# Patient Record
Sex: Male | Born: 1990 | Race: White | Hispanic: No | Marital: Single | Smoking: Current every day smoker
Health system: Southern US, Academic
[De-identification: ages and names within clinical notes are randomized; demographics above are authoritative.]

## PROBLEM LIST (undated history)

## (undated) DIAGNOSIS — Z789 Other specified health status: Secondary | ICD-10-CM

## (undated) HISTORY — PX: DENTAL SURGERY: SHX609

## (undated) HISTORY — PX: SHOULDER SURGERY: SHX246

## (undated) HISTORY — DX: Other specified health status: Z78.9

## (undated) HISTORY — PX: HX BACK SURGERY: SHX140

---

## 2019-04-01 IMAGING — CR XRAY KNEE 3 VIEWS LT
1 series · 3 of 3 positions shown · non-contrast
Comparison: None available.

EXAM:  XRAY KNEE 3 VIEWS LT
INDICATION: Left knee pain.

[Series 5: view not recorded · 0.17mm/px · 3 of 3 slices shown]
[im 1/3]
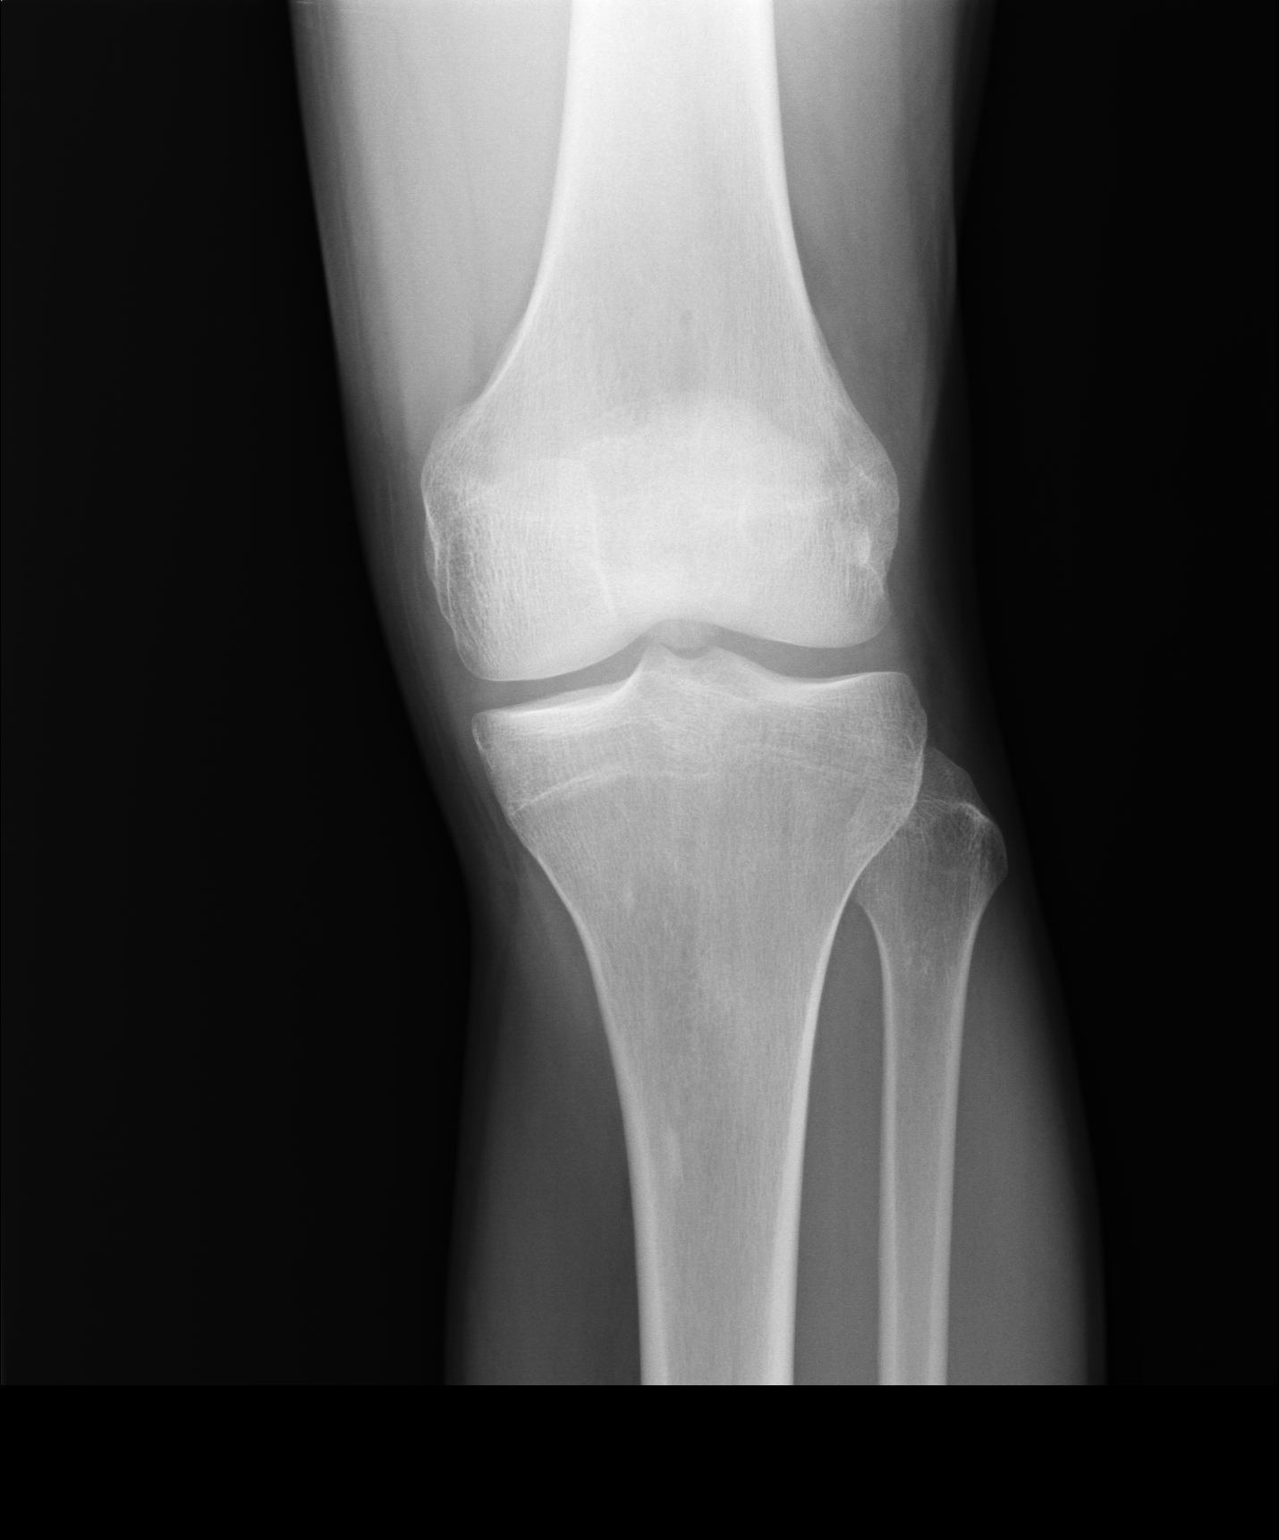
[im 2/3]
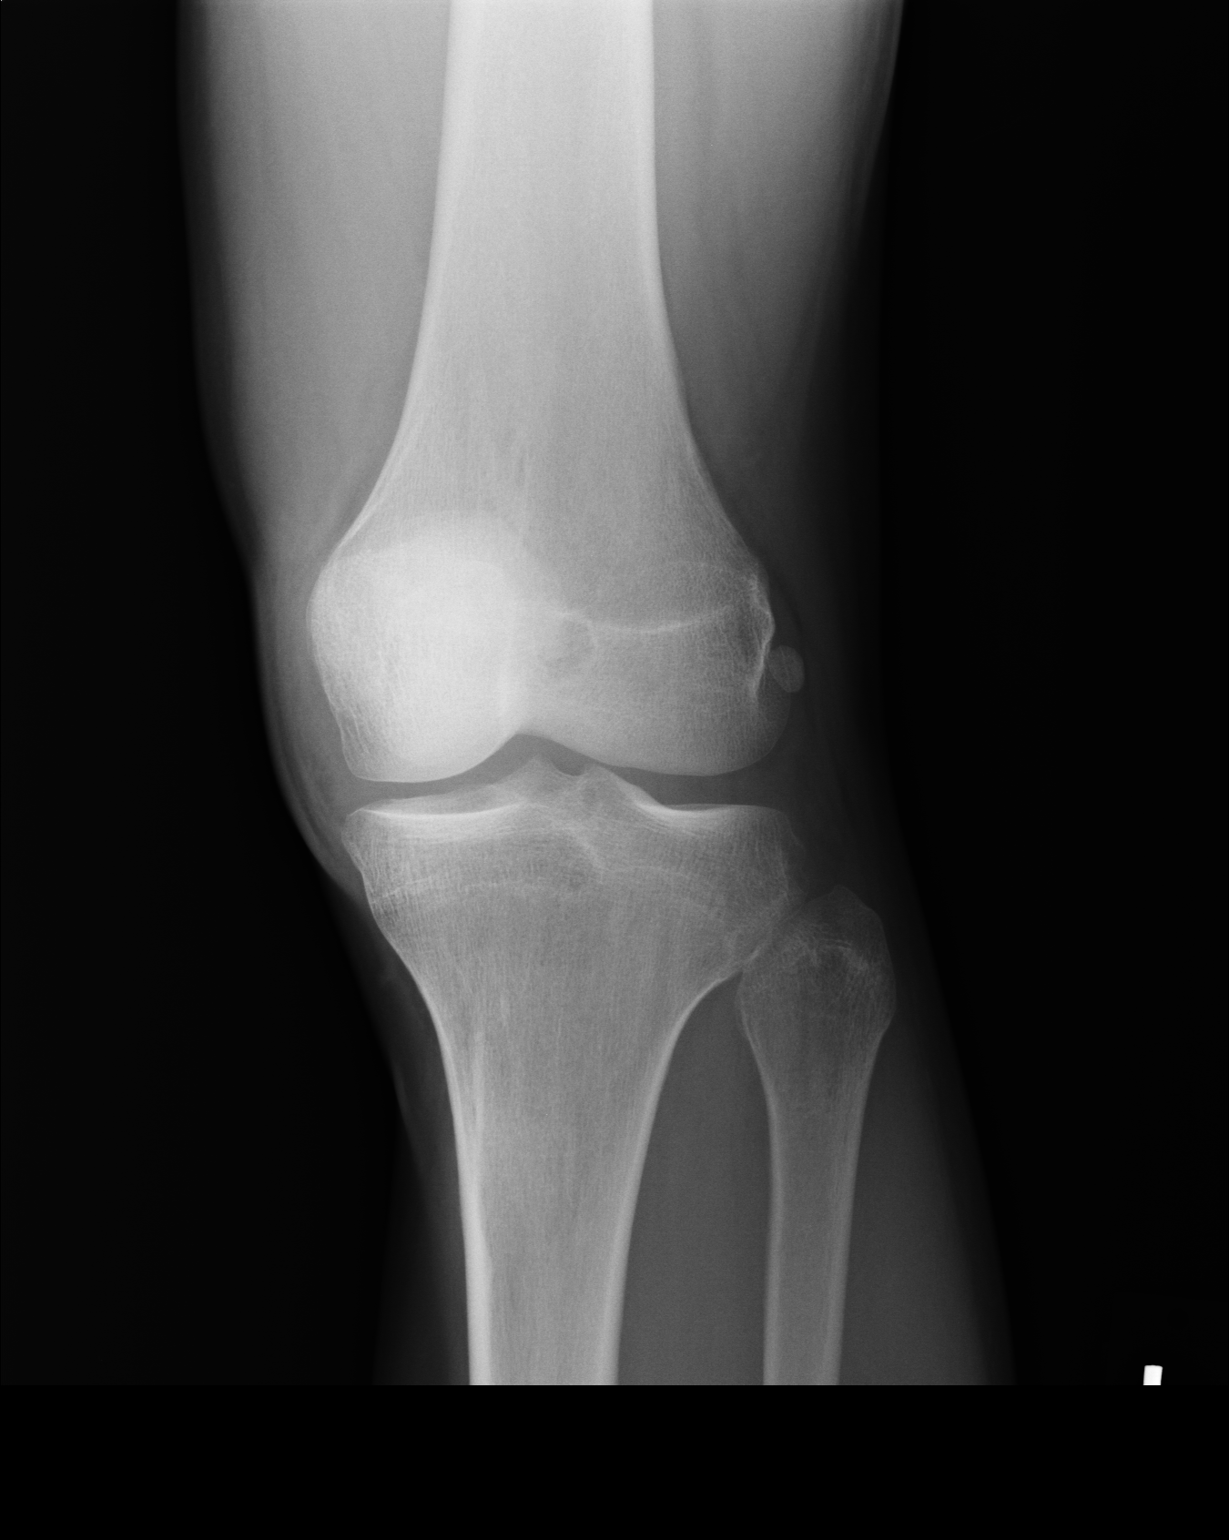
[im 3/3]
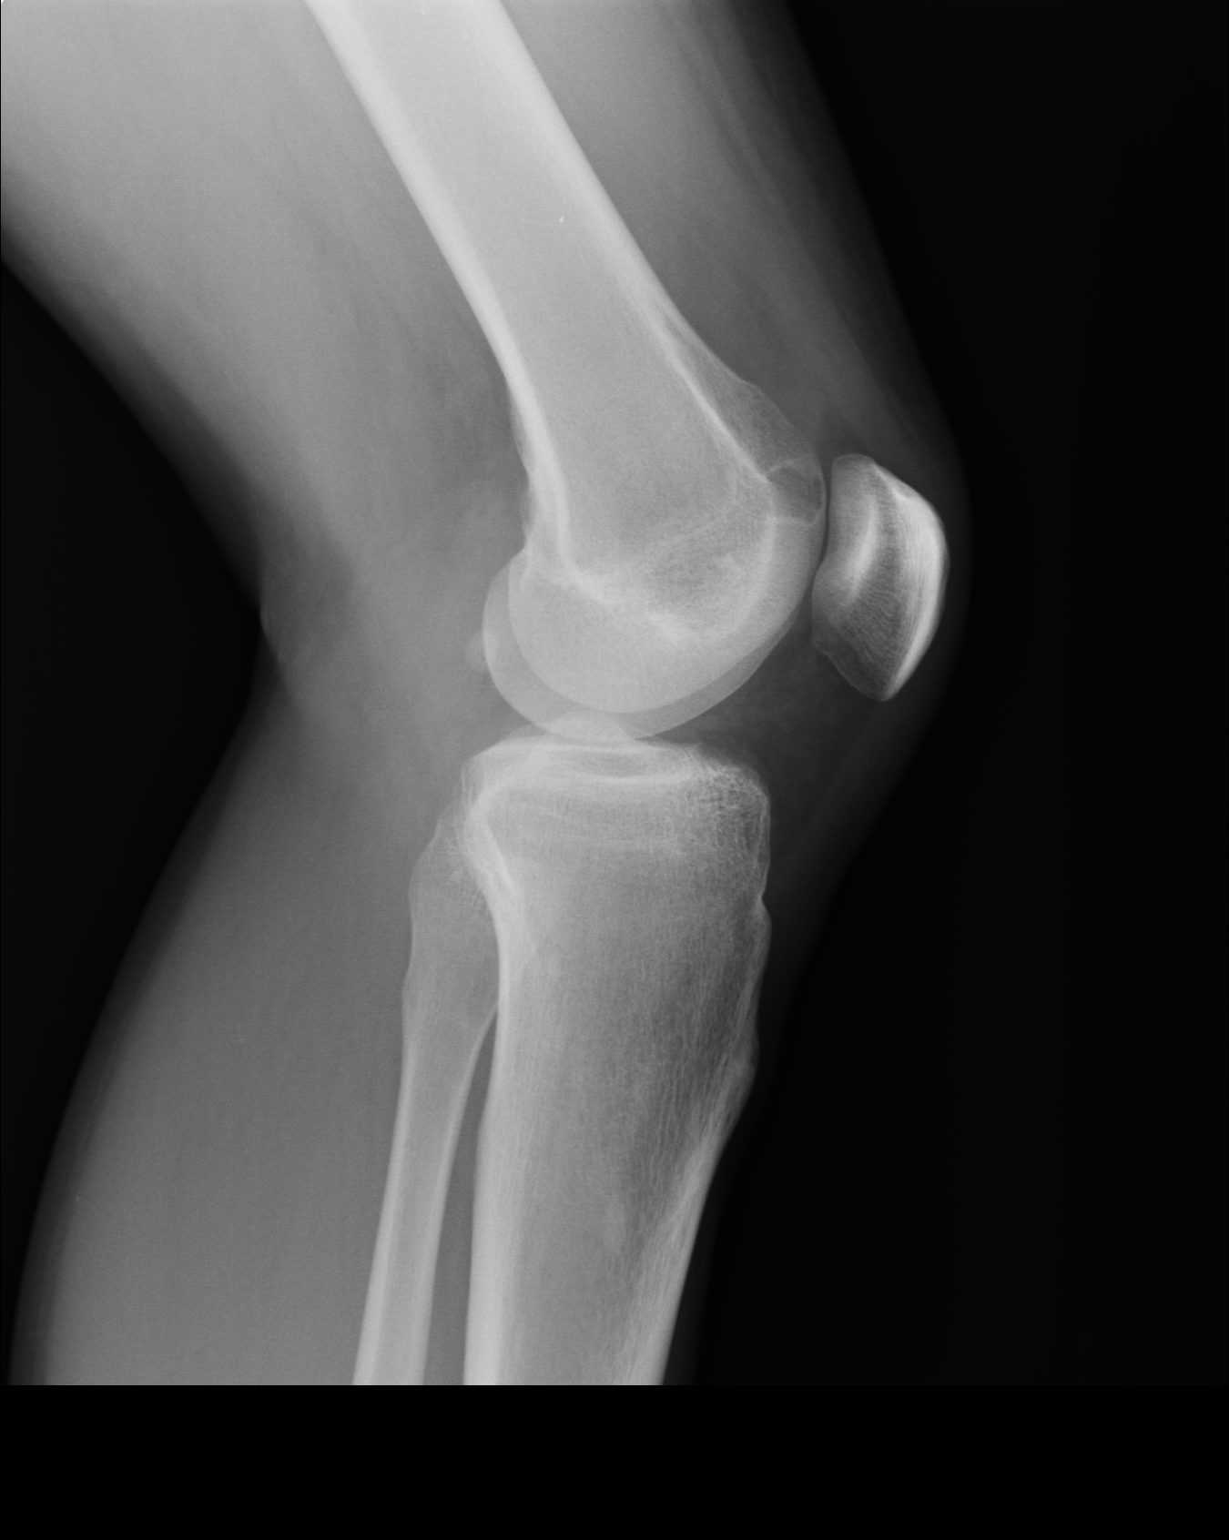

[3 of 3 positions shown; findings below may reference images not displayed]

FINDINGS: Multiple views of the patient’s left knee show normal alignment with adequate joint space and good bony mineralization.  No fracture, dislocation or intraarticular effusion is observed.
IMPRESSION: Unremarkable left knee.

## 2019-05-19 IMAGING — MR MRI LUMBAR SPINE WITHOUT CONTRAST
6 series · 44 of 48 positions shown · IV contrast (gadolinium)
Comparison: None available.

EXAM:  MRI LUMBAR SPINE WITHOUT CONTRAST
INDICATION: Lower back pain with right lower extremity radiculopathy.
TECHNIQUE: Multiplanar multisequential MRI of the lumbar spine was performed without gadolinium contrast.

[Series 9: T1 · sagittal · 4.0mm · 0.94mm/px · 5 of 13 slices shown (1 of 2)]
[im 1/13]
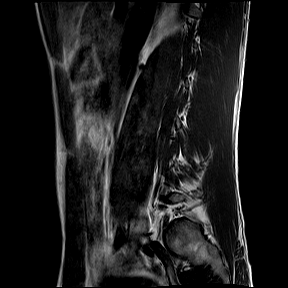
[im 4/13]
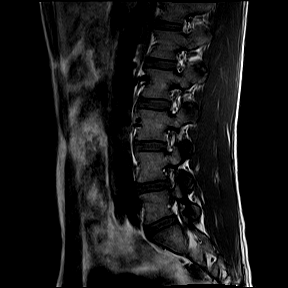
[im 7/13]
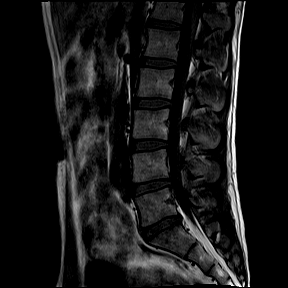
[im 10/13]
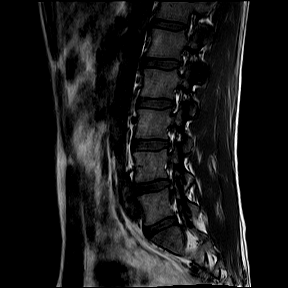
[im 13/13]
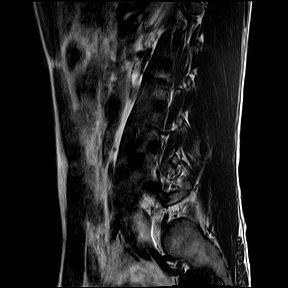

[Series 10: T2 · sagittal · 4.0mm · 0.94mm/px · 6 of 13 slices shown (1 of 3)]
[im 1/13]
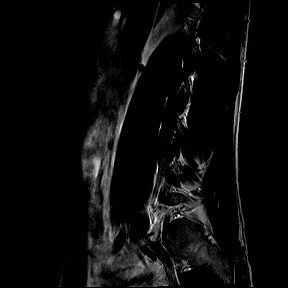
[im 3/13]
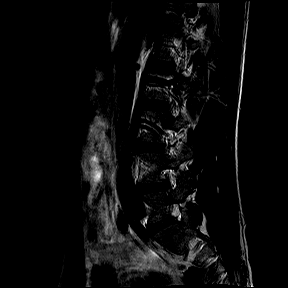
[im 5/13]
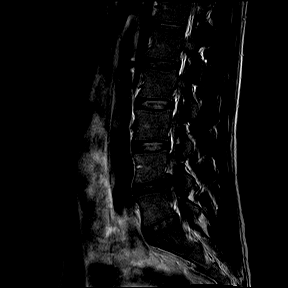
[im 8/13]
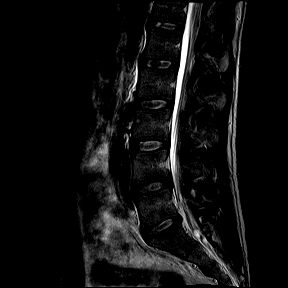
[im 10/13]
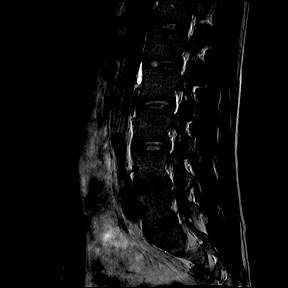
[im 13/13]
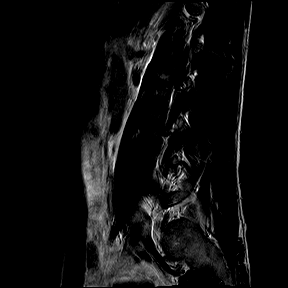

[Series 13: STIR · sagittal · 4.0mm · 1.05mm/px · 5 of 13 slices shown]
[im 1/13]
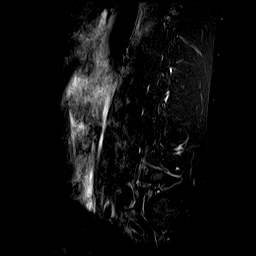
[im 3/13]
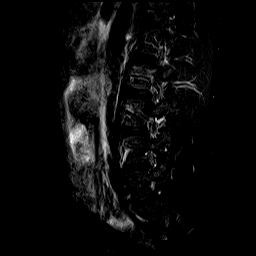
[im 5/13]
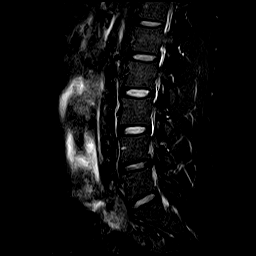
[im 8/13]
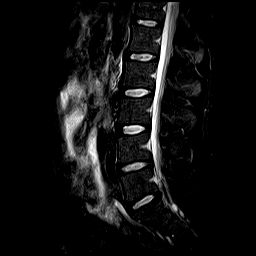
[im 10/13]
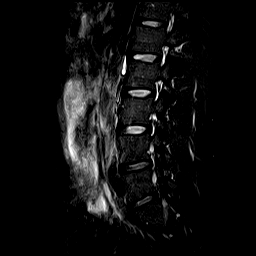

[Series 14: T2 · coronal · 4.0mm · 0.90mm/px · 9 of 20 slices shown (2 of 3)]
[im 1/20]
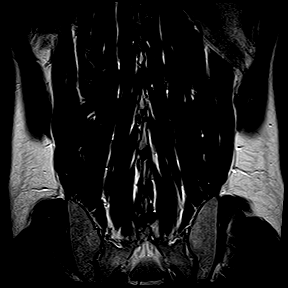
[im 3/20]
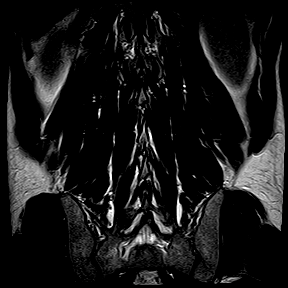
[im 5/20]
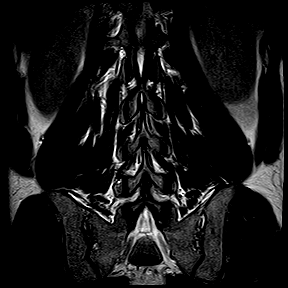
[im 8/20]
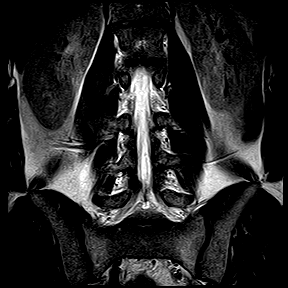
[im 10/20]
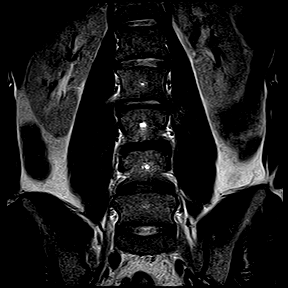
[im 12/20]
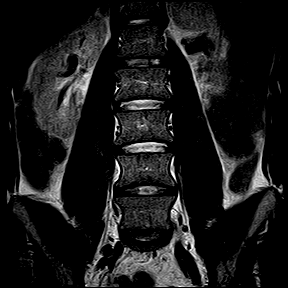
[im 15/20]
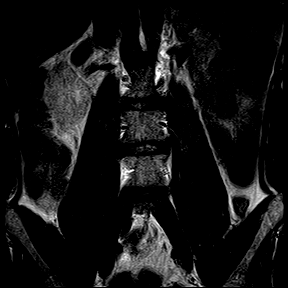
[im 17/20]
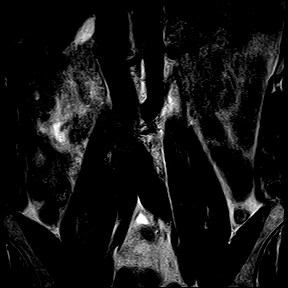
[im 20/20]
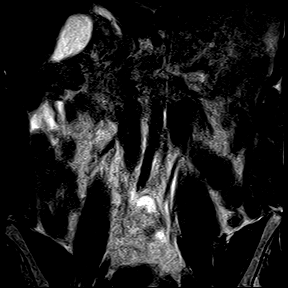

[Series 15: T2 · axial · 4.0mm · 0.47mm/px · z∈[-131,+79]mm · 11 of 23 slices shown (3 of 3)]
[im 1/23]
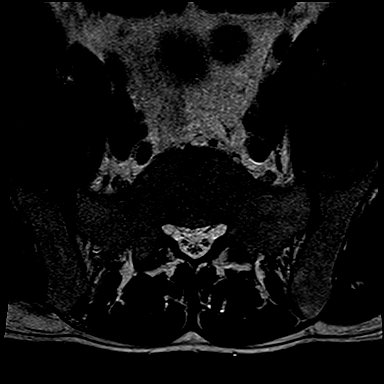
[im 3/23]
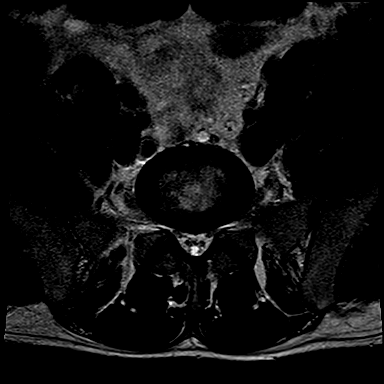
[im 5/23]
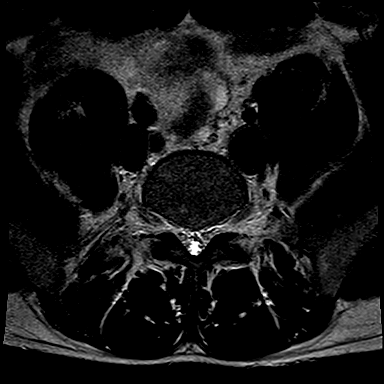
[im 7/23]
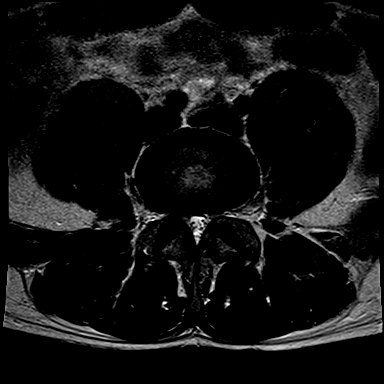
[im 9/23]
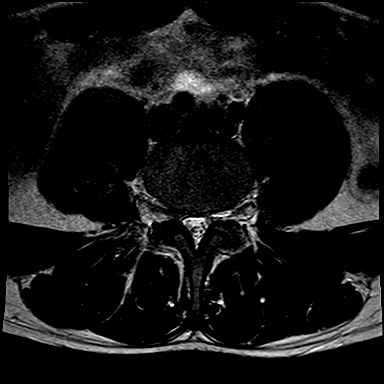
[im 12/23]
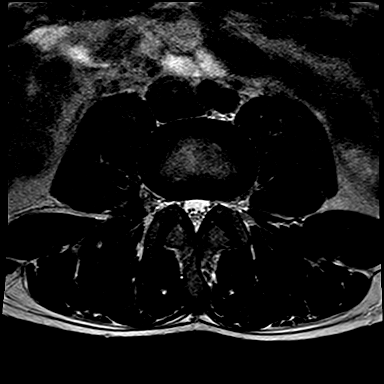
[im 14/23]
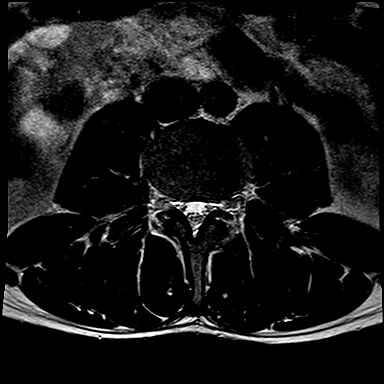
[im 16/23]
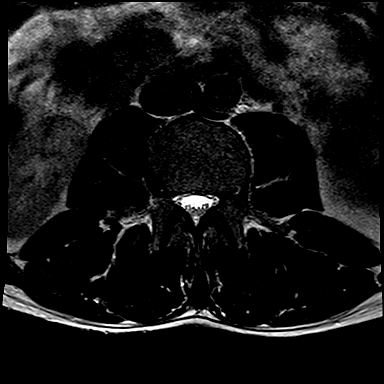
[im 18/23]
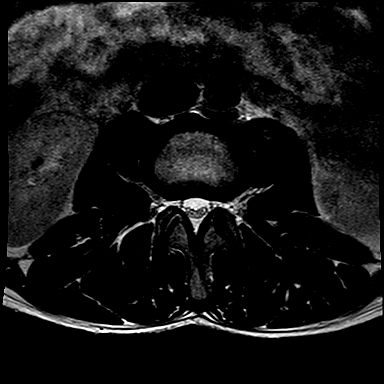
[im 20/23]
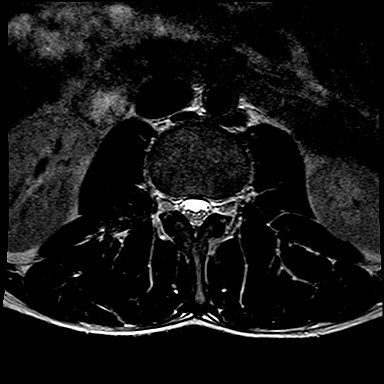
[im 23/23]
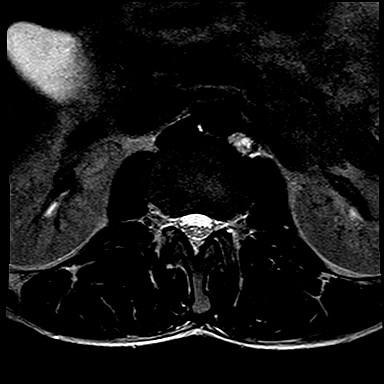

[Series 16: T1 · axial · 4.0mm · 0.47mm/px · z∈[-131,+79]mm · 8 of 23 slices shown (2 of 2)]
[im 1/23]
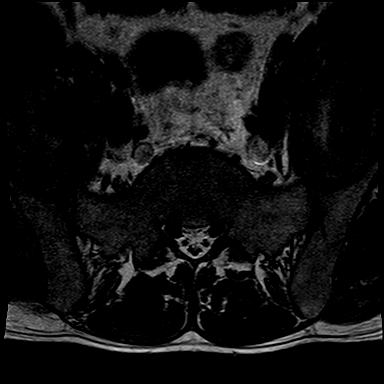
[im 5/23]
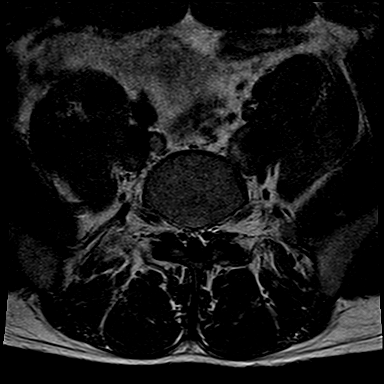
[im 7/23]
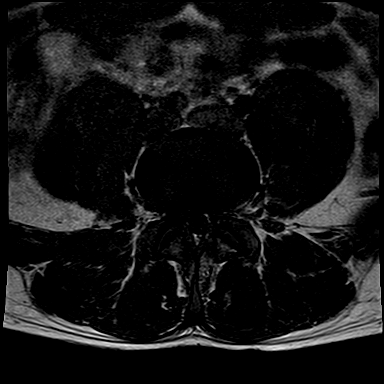
[im 9/23]
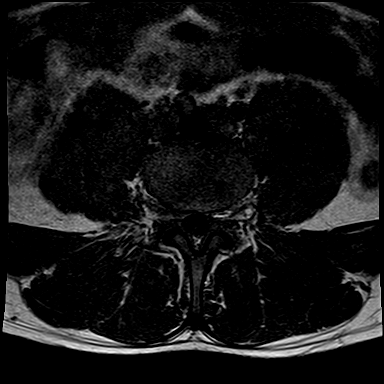
[im 14/23]
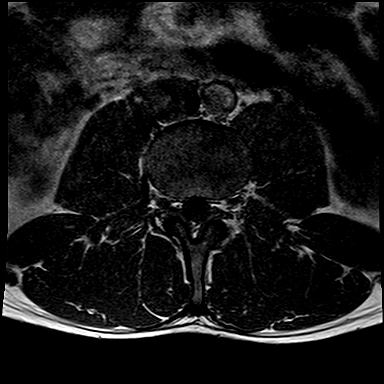
[im 16/23]
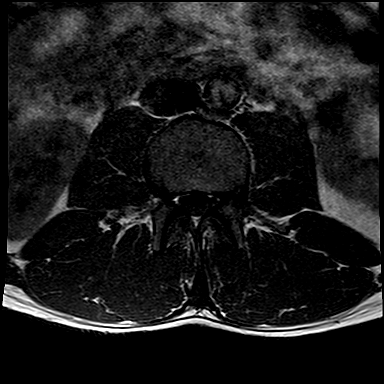
[im 18/23]
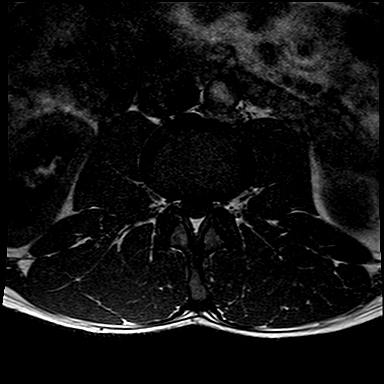
[im 23/23]
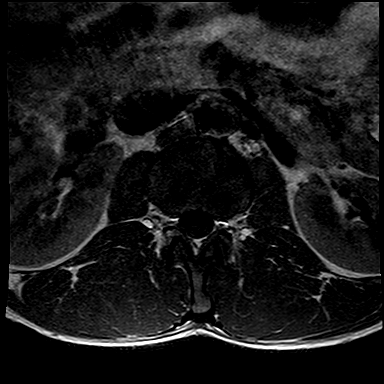

[44 of 48 positions shown; findings below may reference images not displayed]

FINDINGS: Vertebral bodies are normal in height, alignment and signal intensity. There is no acute fracture or subluxation. Distal spinal cord is normal in signal intensity and terminates normally at T12-L1 disc space level. Spinal canal is congenitally narrow.

L1-2, L2-3 and L3-4 levels are unremarkable.

At L4-5 level, there is a small broad-based right paracentral disc bulge resulting in severe right lateral recess compression. There is mild left and moderate right neural foraminal stenosis from facet arthropathy and bulging annulus.

L5-S1 level and paraspinal soft tissues are unremarkable.
IMPRESSION: Severe right lateral recess compression at L4-5 level from a small right paracentral disc bulge.

## 2021-03-29 IMAGING — MR MRI LUMBAR SPINE WITHOUT CONTRAST
5 of 6 series · 32 of 48 positions shown · IV contrast (gadolinium)
Comparison: MRI dated 05/19/2019.

﻿EXAM:  [id number]   MRI LUMBAR SPINE WITHOUT CONTRAST
INDICATION: Lower back pain with right lower extremity radiculopathy. History of surgery in July 2019.
TECHNIQUE: Multiplanar multisequential MRI of the lumbar spine was performed without gadolinium contrast.

[Series 5: T2 · sagittal · 4.0mm · 0.83mm/px · 6 of 13 slices shown (1 of 3)]
[im 1/13]
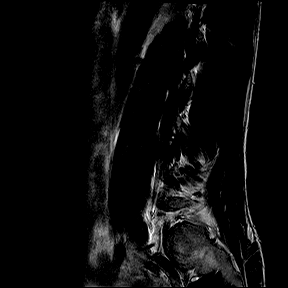
[im 3/13]
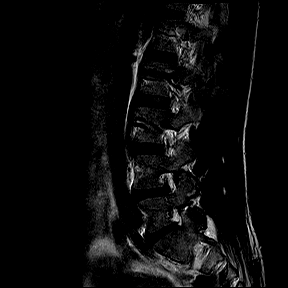
[im 5/13]
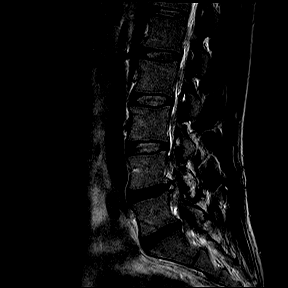
[im 8/13]
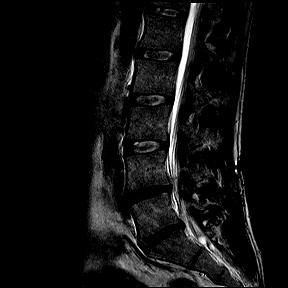
[im 10/13]
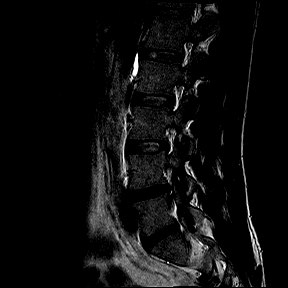
[im 13/13]
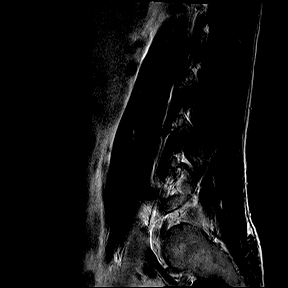

[Series 6: T1 · sagittal · 4.0mm · 0.83mm/px · 6 of 13 slices shown (1 of 2)]
[im 1/13]
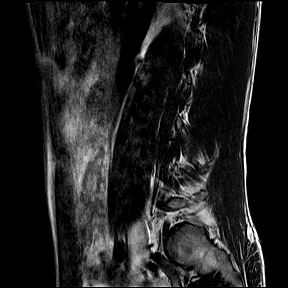
[im 3/13]
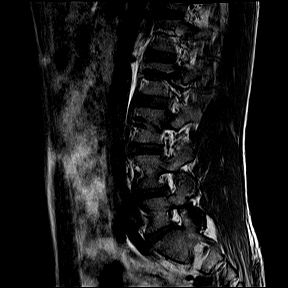
[im 5/13]
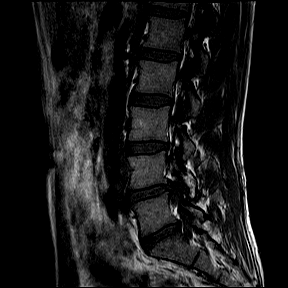
[im 8/13]
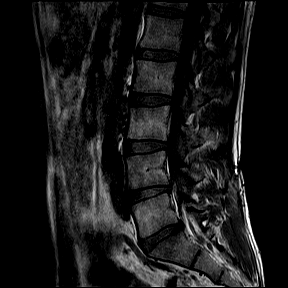
[im 10/13]
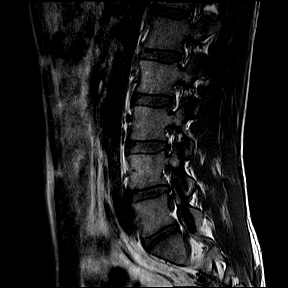
[im 13/13]
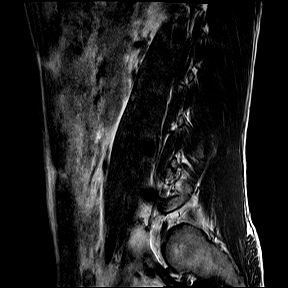

[Series 8: T2 · axial · 4.0mm · 0.52mm/px · z∈[-111,+106]mm · 11 of 23 slices shown (2 of 3)]
[im 1/23]
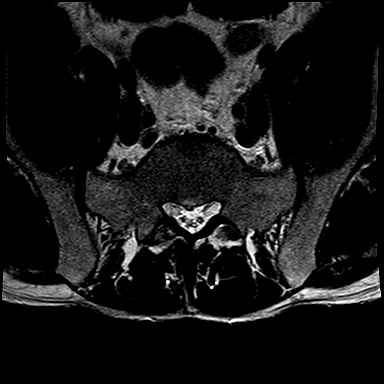
[im 3/23]
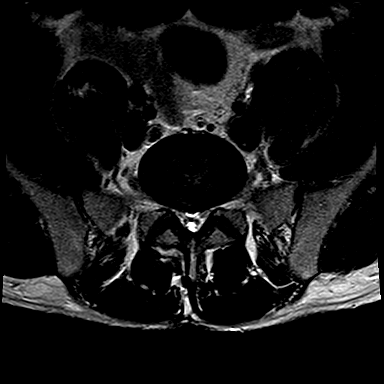
[im 5/23]
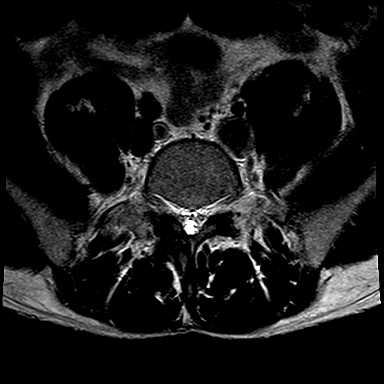
[im 7/23]
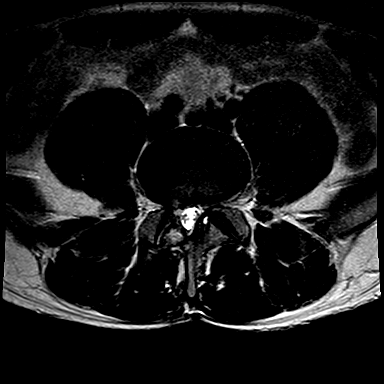
[im 9/23]
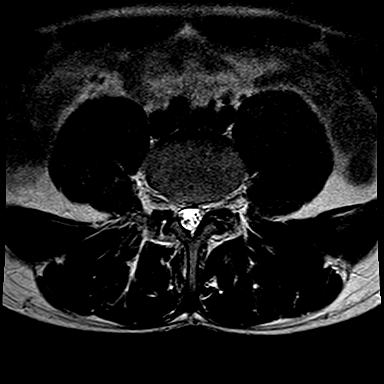
[im 12/23]
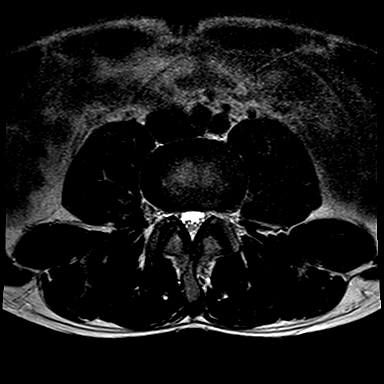
[im 14/23]
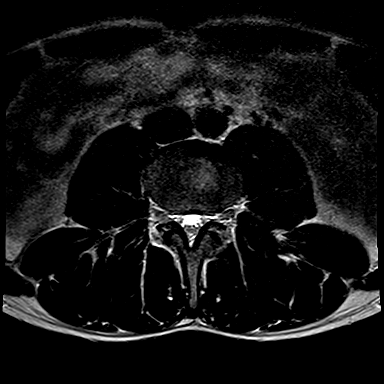
[im 16/23]
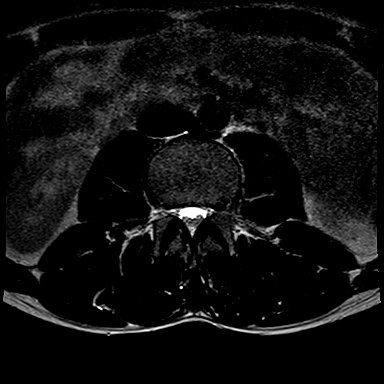
[im 18/23]
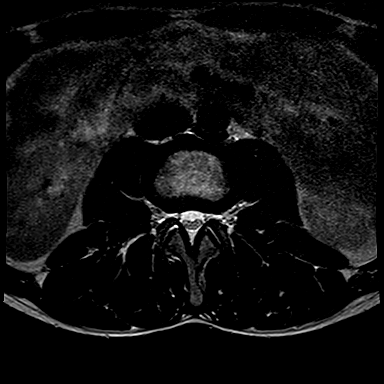
[im 20/23]
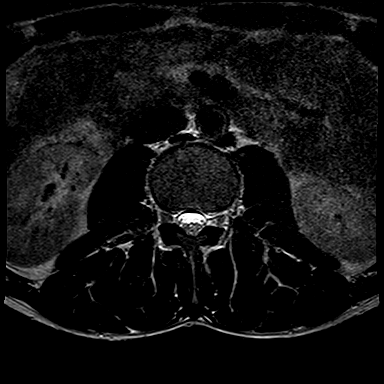
[im 23/23]
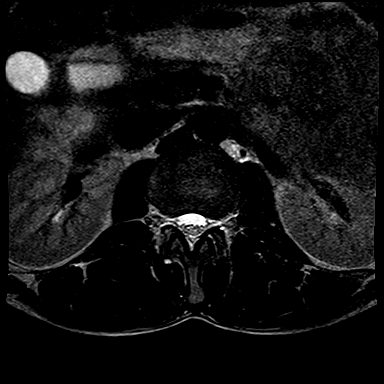

[Series 9: T1 · axial · 4.0mm · 0.52mm/px · 1 of 23 slices shown (2 of 2)]
[im 1/23]
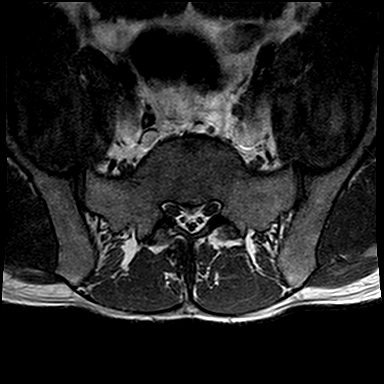

[Series 10: T2 · coronal · 5.0mm · 0.82mm/px · 8 of 18 slices shown (3 of 3)]
[im 1/18]
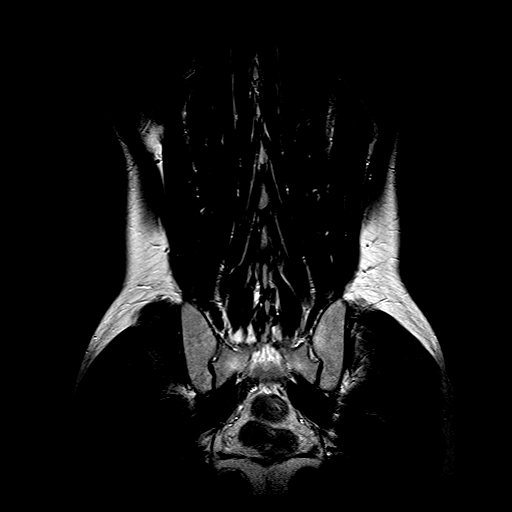
[im 3/18]
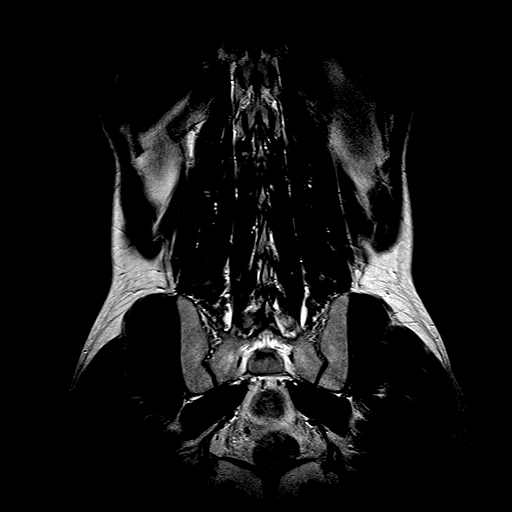
[im 5/18]
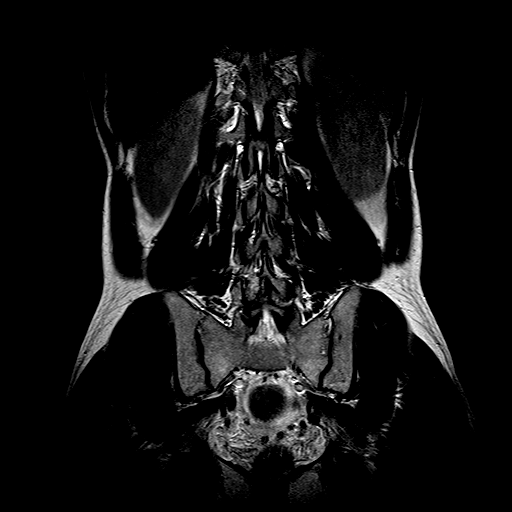
[im 8/18]
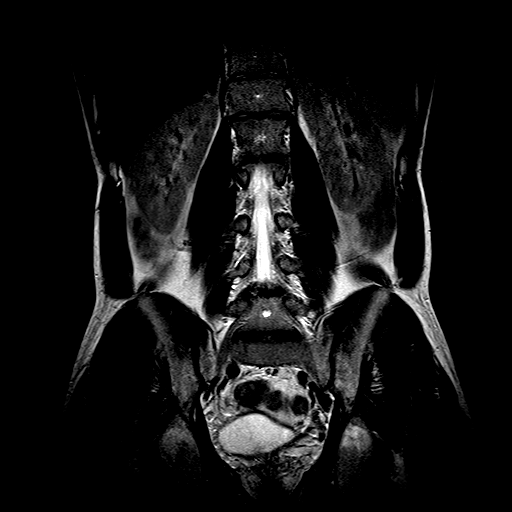
[im 10/18]
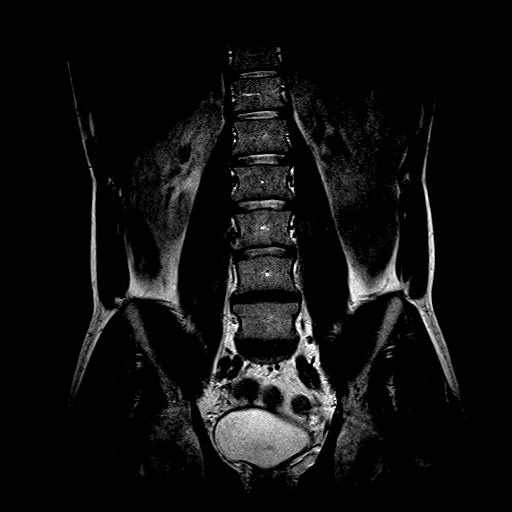
[im 13/18]
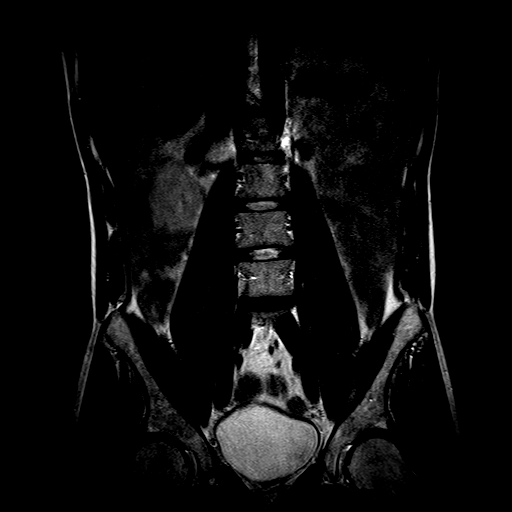
[im 15/18]
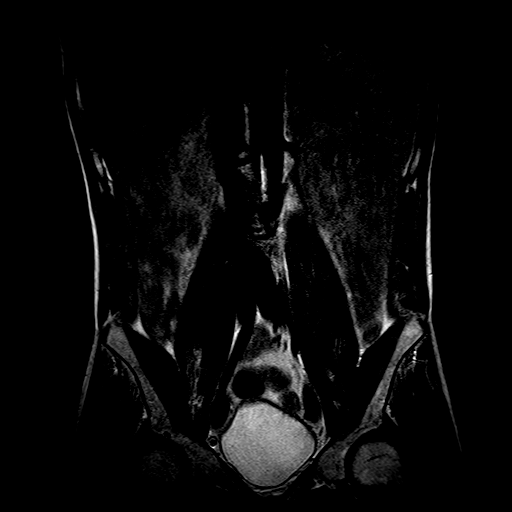
[im 18/18]
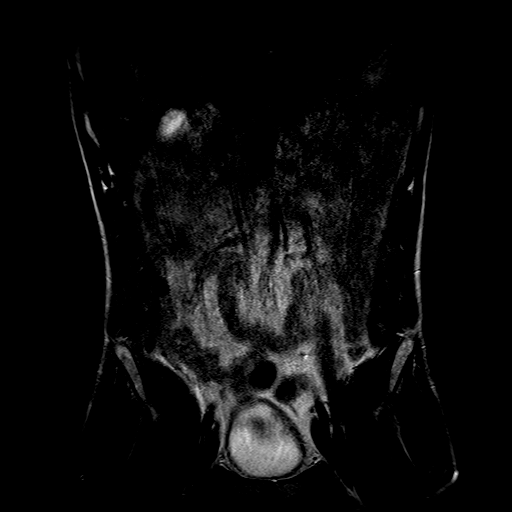

[32 of 48 positions shown; findings below may reference images not displayed]

FINDINGS: Vertebral bodies are normal in height, alignment and signal intensity. There is no acute fracture or subluxation. Distal spinal cord is normal in signal intensity and terminates normally at T12-L1 disc space level. Spinal canal is congenitally narrow.

L1-2, L2-3 and L3-4 levels are unremarkable. 

At L4-5 level, there is suggestion of a new right hemilaminectomy defect.  There is a small right paracentral disc bulge resulting in moderate to severe right lateral recess compression. There is also moderate right and mild left neural foraminal stenosis from facet arthropathy and bulging annulus. 

At L5-S1 level, there is a small broad-based central disc bulge with associated annular fissure without mass effect on the thecal sac.  There is no significant neural foraminal stenosis. 

Paraspinal soft tissues are unremarkable.
IMPRESSION: 1. Suggestion of a small recurrent right paracentral disc bulge at L4-5 level resulting in moderate to severe right lateral recess compression.  

2. Moderate right and mild left neural foraminal stenosis at L4-5 level from facet arthropathy and bulging annulus.

## 2022-01-08 ENCOUNTER — Other Ambulatory Visit: Payer: Self-pay

## 2022-01-08 ENCOUNTER — Encounter (HOSPITAL_BASED_OUTPATIENT_CLINIC_OR_DEPARTMENT_OTHER): Payer: Self-pay | Admitting: Emergency Medicine

## 2022-01-08 ENCOUNTER — Emergency Department
Admission: EM | Admit: 2022-01-08 | Discharge: 2022-01-08 | Disposition: A | Discharge status: Home / Self Care | Payer: BLUE CROSS/BLUE SHIELD | Attending: Emergency Medicine | Admitting: Emergency Medicine

## 2022-01-08 ENCOUNTER — Emergency Department (HOSPITAL_BASED_OUTPATIENT_CLINIC_OR_DEPARTMENT_OTHER): Payer: BLUE CROSS/BLUE SHIELD

## 2022-01-08 DIAGNOSIS — R1031 Right lower quadrant pain: Secondary | ICD-10-CM

## 2022-01-08 DIAGNOSIS — S39011A Strain of muscle, fascia and tendon of abdomen, initial encounter: Secondary | ICD-10-CM | POA: Insufficient documentation

## 2022-01-08 LAB — COMPREHENSIVE METABOLIC PANEL, NON-FASTING
ALBUMIN/GLOBULIN RATIO: 1.4 (ref 0.8–1.4)
ALBUMIN: 4.2 g/dL (ref 3.4–5.0)
ALKALINE PHOSPHATASE: 60 U/L (ref 46–116)
ALT (SGPT): 49 U/L (ref <=78)
ANION GAP: 8 mmol/L (ref 4–13)
AST (SGOT): 24 U/L (ref 15–37)
BILIRUBIN TOTAL: 0.5 mg/dL (ref 0.2–1.0)
BUN/CREA RATIO: 12
BUN: 12 mg/dL (ref 7–18)
CALCIUM, CORRECTED: 8.7 mg/dL
CALCIUM: 8.9 mg/dL (ref 8.5–10.1)
CHLORIDE: 101 mmol/L (ref 98–107)
CO2 TOTAL: 29 mmol/L (ref 21–32)
CREATININE: 0.99 mg/dL (ref 0.70–1.30)
ESTIMATED GFR: 104 mL/min/{1.73_m2} (ref >59)
GLOBULIN: 3.1
GLUCOSE: 87 mg/dL (ref 74–106)
OSMOLALITY, CALCULATED: 275 mOsm/kg (ref 270–290)
POTASSIUM: 3.8 mmol/L (ref 3.5–5.1)
PROTEIN TOTAL: 7.3 g/dL (ref 6.4–8.2)
SODIUM: 138 mmol/L (ref 136–145)

## 2022-01-08 LAB — PTT (PARTIAL THROMBOPLASTIN TIME): APTT: 35.5 seconds — ABNORMAL HIGH (ref 22.4–31.7)

## 2022-01-08 LAB — URINALYSIS, MACRO/MICRO
BILIRUBIN: NEGATIVE mg/dL
BLOOD: NEGATIVE mg/dL
GLUCOSE: NEGATIVE mg/dL
KETONES: 15 mg/dL — AB
LEUKOCYTES: NEGATIVE WBCs/uL
NITRITE: NEGATIVE
PH: 6 (ref 4.6–8.0)
PROTEIN: NEGATIVE mg/dL
SPECIFIC GRAVITY: 1.025 (ref 1.003–1.035)
UROBILINOGEN: 1 mg/dL (ref 0.2–1.0)

## 2022-01-08 LAB — CBC WITH DIFF
BASOPHIL #: 0.1 10*3/uL (ref 0.00–0.10)
BASOPHIL %: 1 % (ref 0–1)
EOSINOPHIL #: 0.1 10*3/uL (ref 0.00–0.50)
EOSINOPHIL %: 1 %
HCT: 42.5 % (ref 36.7–47.1)
HGB: 14.6 g/dL (ref 12.5–16.3)
LYMPHOCYTE #: 1.3 10*3/uL (ref 1.00–3.00)
LYMPHOCYTE %: 13 % — ABNORMAL LOW (ref 16–44)
MCH: 34.6 pg — ABNORMAL HIGH (ref 23.8–33.4)
MCHC: 34.3 g/dL (ref 32.5–36.3)
MCV: 100.9 fL — ABNORMAL HIGH (ref 73.0–96.2)
MONOCYTE #: 1.1 10*3/uL — ABNORMAL HIGH (ref 0.30–1.00)
MONOCYTE %: 11 % (ref 5–13)
MPV: 8.1 fL (ref 7.4–11.4)
NEUTROPHIL #: 7.4 10*3/uL (ref 1.85–7.80)
NEUTROPHIL %: 75 % (ref 43–77)
PLATELETS: 339 10*3/uL (ref 140–440)
RBC: 4.21 10*6/uL (ref 4.06–5.63)
RDW: 13.1 % (ref 12.1–16.2)
WBC: 9.9 10*3/uL (ref 3.6–10.2)

## 2022-01-08 LAB — LIPASE: LIPASE: 12 U/L (ref 11–82)

## 2022-01-08 LAB — PT/INR
INR: 0.99 (ref 0.88–1.10)
PROTHROMBIN TIME: 11.5 seconds (ref 9.8–12.7)

## 2022-01-08 MED ORDER — ONDANSETRON HCL (PF) 4 MG/2 ML INJECTION SOLUTION
4.0000 mg | INTRAMUSCULAR | Status: AC
Start: 2022-01-08 — End: 2022-01-08
  Administered 2022-01-08: 4 mg via INTRAVENOUS

## 2022-01-08 MED ORDER — FAMOTIDINE 40 MG TABLET
20.0000 mg | ORAL_TABLET | Freq: Two times a day (BID) | ORAL | 0 refills | Status: AC
Start: 2022-01-08 — End: ?

## 2022-01-08 MED ORDER — ONDANSETRON HCL (PF) 4 MG/2 ML INJECTION SOLUTION
INTRAMUSCULAR | Status: AC
Start: 2022-01-08 — End: 2022-01-08
  Filled 2022-01-08: qty 2

## 2022-01-08 MED ORDER — IBUPROFEN 600 MG TABLET
600.0000 mg | ORAL_TABLET | Freq: Four times a day (QID) | ORAL | 0 refills | Status: AC | PRN
Start: 2022-01-08 — End: ?

## 2022-01-08 MED ORDER — DICYCLOMINE 10 MG CAPSULE
10.0000 mg | ORAL_CAPSULE | Freq: Two times a day (BID) | ORAL | 0 refills | Status: AC
Start: 2022-01-08 — End: ?

## 2022-01-08 MED ORDER — SODIUM CHLORIDE 0.9 % IV BOLUS
1000.0000 mL | INJECTION | Status: AC
Start: 2022-01-08 — End: 2022-01-08
  Administered 2022-01-08: 1000 mL via INTRAVENOUS

## 2022-01-08 NOTE — ED Provider Notes (Signed)
[facility], [facility] Emergency Department  ED Primary Provider Note  History of Present Illness   Chief Complaint   Patient presents with    Abdominal Pain     Oscar Riley is a 31 y.o. male who had concerns including Abdominal Pain.  Arrival: The patient arrived by Car complaining of right lower quadrant abdominal pain which started this morning when he woke up.  Patient states that he was nauseous with the pain patient has not eaten anything since this morning.  He states he just does not feel hungry.  He denies any fever chills.  No chest pain or shortness of breath.  Patient states he did have diarrhea this morning but denies any vomiting.  Patient went to [facility] who sent him to the ER to rule out appendicitis.    HPI  Review of Systems   Review of Systems   Constitutional:  Positive for activity change and appetite change. Negative for chills and fever.   HENT:  Negative for ear pain and sore throat.    Eyes:  Negative for pain and visual disturbance.   Respiratory:  Negative for cough and shortness of breath.    Cardiovascular:  Negative for chest pain and palpitations.   Gastrointestinal:  Positive for abdominal pain, diarrhea and nausea. Negative for vomiting.   Genitourinary:  Negative for dysuria and hematuria.   Musculoskeletal:  Negative for arthralgias and back pain.   Skin:  Negative for color change and rash.   Neurological:  Negative for seizures and syncope.   All other systems reviewed and are negative.     Historical Data   History Reviewed This Encounter: Medical History  Surgical History  Family History  Social History    Physical Exam   ED Triage Vitals [01/08/22 1303]   BP (Non-Invasive) (!) 150/93   Heart Rate 62   Respiratory Rate 20   Temperature 36.4 C (97.5 F)   SpO2 99 %   Weight 79.4 kg (175 lb 1.6 oz)   Height 1.854 m (_0 )     Physical Exam  Vitals and nursing note reviewed.   Constitutional:       General: He is not in acute distress.      Appearance: He is well-developed and normal weight.   HENT:      Head: Normocephalic and atraumatic.      Right Ear: External ear normal.      Left Ear: External ear normal.      Nose: Nose normal.      Mouth/Throat:      Mouth: Mucous membranes are dry.   Eyes:      Extraocular Movements: Extraocular movements intact.      Conjunctiva/sclera: Conjunctivae normal.      Pupils: Pupils are equal, round, and reactive to light.   Cardiovascular:      Rate and Rhythm: Normal rate and regular rhythm.      Pulses: Normal pulses.      Heart sounds: Normal heart sounds. No murmur heard.  Pulmonary:      Effort: Pulmonary effort is normal. No respiratory distress.      Breath sounds: Normal breath sounds.   Abdominal:      General: Bowel sounds are normal.      Palpations: Abdomen is soft.      Tenderness: There is abdominal tenderness.      Comments: Positive tenderness at McBurney's point, right lower quadrant.   Musculoskeletal:  General: No swelling. Normal range of motion.      Cervical back: Normal range of motion and neck supple.   Skin:     General: Skin is warm and dry.      Capillary Refill: Capillary refill takes less than 2 seconds.   Neurological:      General: No focal deficit present.      Mental Status: He is alert and oriented to person, place, and time.   Psychiatric:         Mood and Affect: Mood normal.         Thought Content: Thought content normal.         Judgment: Judgment normal.       Patient Data     Labs Ordered/Reviewed   PTT (PARTIAL THROMBOPLASTIN TIME) - Abnormal; Notable for the following components:       Result Value    APTT 35.5 (*)     All other components within normal limits   URINALYSIS, MACRO/MICRO - Abnormal; Notable for the following components:    KETONES 15 (*)     All other components within normal limits   PT/INR - Normal    Narrative:     INR OF 2.0-3.0  RECOMMENDED FOR: PROPHYLAXIS/TREATMENT OF VENEOUS THROMBOSIS, PULMONARY EMBOLISM, PREVENTION OF SYSTEMIC EMBOLISM FROM  ATRIAL FIBRILATION, MYOCARDIAL INFARCTION.    INR OF 2.5-3.5  RECOMMENDED FOR MECHANICAL PROSTHETIC HEART VALVES, RECURRENT SYSTEMIC EMBOLISM, RECURRENT MYOCARDIAL INFARCTION.     COMPREHENSIVE METABOLIC PANEL, NON-FASTING    Narrative:     Estimated Glomerular Filtration Rate (eGFR) is calculated using the CKD-EPI (2021) equation, intended for patients 73 years of age and older. If gender is not documented or "unknown", there will be no eGFR calculation.   URINALYSIS WITH REFLEX MICROSCOPIC AND CULTURE IF POSITIVE    Narrative:     The following orders were created for panel order URINALYSIS WITH REFLEX MICROSCOPIC AND CULTURE IF POSITIVE.  Procedure                               Abnormality         Status                     ---------                               -----------         ------                     URINALYSIS, MACRO/MICRO[[id number]]      Abnormal            Final result                 Please view results for these tests on the individual orders.     CT ABDOMEN PELVIS WO IV CONTRAST   Final Result by Edi, [facility] In (12/10 1417)   NO ACUTE FINDINGS AT THE ABDOMEN OR PELVIS ON NONCONTRAST CT.          One or more dose reduction techniques were used (e.g., Automated exposure control, adjustment of the mA and/or kV according to patient size, use of iterative reconstruction technique).         Radiologist location ID: [facility]  Medical Decision Making  Amount and/or Complexity of Data Reviewed  Labs: ordered.  Radiology: ordered.    Risk  Prescription drug management.             Medications Administered in the ED   NS bolus infusion 1,000 mL (1,000 mL Intravenous New Bag/New Syringe 01/08/22 1411)   ondansetron (ZOFRAN) 2 mg/mL injection (4 mg Intravenous Given 01/08/22 1411)     Clinical Impression   RLQ abdominal pain (Primary)   Abdominal wall strain, initial encounter       Disposition: Discharged               Clinical Impression   RLQ abdominal pain  (Primary)   Abdominal wall strain, initial encounter       Discharge Medication List as of 01/08/2022  3:41 PM        START taking these medications    Details   dicyclomine (BENTYL) 10 mg Oral Capsule Take 1 Capsule (10 mg total) by mouth Twice daily, Disp-20 Capsule, R-0, E-Rx      famotidine (PEPCID) 40 mg Oral Tablet Take 0.5 Tablets (20 mg total) by mouth Twice daily, Disp-20 Tablet, R-0, E-Rx      Ibuprofen (MOTRIN) 600 mg Oral Tablet Take 1 Tablet (600 mg total) by mouth Four times a day as needed for Pain, Disp-30 Tablet, R-0, E-Rx

## 2022-01-08 NOTE — Discharge Instructions (Signed)
Return for any concern. Avoid heavy lifting nor straining. Return if not better, or worse it is not very common but early appendicitis can be missed on ct so do not hesitate to return if concerns. You may have pulled a muscle stretching.

## 2022-01-08 NOTE — ED Triage Notes (Signed)
Woke up this AM with RLQ abdominal pain. Reports its a dull aching pain that increases with movement.

## 2022-01-09 ENCOUNTER — Telehealth (HOSPITAL_BASED_OUTPATIENT_CLINIC_OR_DEPARTMENT_OTHER): Payer: Self-pay

## 2022-01-09 NOTE — ED Nurses Note (Signed)
Follow-up 1: Left message requesting call back.

## 2022-01-10 ENCOUNTER — Telehealth (HOSPITAL_BASED_OUTPATIENT_CLINIC_OR_DEPARTMENT_OTHER): Payer: Self-pay

## 2022-01-10 NOTE — ED Nurses Note (Signed)
Follow-up 2: Left message requesting call back.

## 2022-01-12 ENCOUNTER — Telehealth (HOSPITAL_BASED_OUTPATIENT_CLINIC_OR_DEPARTMENT_OTHER): Payer: Self-pay

## 2022-01-12 NOTE — ED Nurses Note (Signed)
Follow-up 3: Left message requesting call back.
